# Patient Record
Sex: Male | Born: 1970 | Race: White | Hispanic: No | Marital: Married
Health system: Southern US, Community
[De-identification: ages and names within clinical notes are randomized; demographics above are authoritative.]

---

## 2012-04-20 ENCOUNTER — Ambulatory Visit: Payer: Self-pay | Admitting: Emergency Medicine

## 2015-01-07 NOTE — Op Note (Signed)
PATIENT NAME:  Stephen Allison, Stephen Allison MR#:  [id number] DATE OF BIRTH:  [birth year]  DATE OF PROCEDURE:  04/20/2012  PREOPERATIVE DIAGNOSIS: Right inguinal hernia.  POSTOPERATIVE DIAGNOSIS: Right inguinal hernia.  PROCEDURE: Right inguinal hernia with mesh.   SURGEON: Jovita GammaMasud Ahnna Dungan, MD  DESCRIPTION OF PROCEDURE: Under general anesthesia, the right colon was then prepped and draped. A small incision was made in the right groin. After cutting skin and subcutaneous tissue, the fascia was cut. The fascia was then reached. After cutting through the fascia, the inguinal canal was exposed. The cord was then lifted up. The patient was found to have a large lipoma of the cord which was then excised all the way down to the base of the inguinal ring and was transected and tied and after that we made sure that there was no injury to vas deferens. After this was done, there was no other hernia seen and then Prolene mesh was then placed below around the cord and sutured with 0 Vicryl sutures all around in the shelving edge of the inguinal ligament and conjoined tendon and around the cord. The inguinal canal was then closed with interrupted 0 Vicryl sutures. The subcuticular tissue was closed with 3-0 Vicryl. The skin was closed with staples. The patient tolerated the procedure well and was sent to the recovery room in satisfactory condition.  ____________________________ Alton RevereMasud S. Cecelia ByarsHashmi, MD [id number] D: 04/20/2012 10:54:17 ET T: 04/20/2012 11:55:15 ET JOB#: [id number]  cc: Kylie Gros S. Cecelia ByarsHashmi, MD, <Dictator> Ladona RidgelJarrod D. Kanady, FNP Meryle ReadyMASUD S Jamila Slatten MD ELECTRONICALLY SIGNED 04/21/2012 6:29

## 2017-01-24 ENCOUNTER — Other Ambulatory Visit: Payer: Self-pay | Admitting: Family Medicine

## 2017-01-24 ENCOUNTER — Ambulatory Visit
Admission: RE | Admit: 2017-01-24 | Discharge: 2017-01-24 | Disposition: A | Discharge status: Home / Self Care | Payer: 59 | Source: Ambulatory Visit | Attending: Family Medicine | Admitting: Family Medicine

## 2017-01-24 DIAGNOSIS — R1031 Right lower quadrant pain: Secondary | ICD-10-CM | POA: Diagnosis not present

## 2017-01-24 DIAGNOSIS — N433 Hydrocele, unspecified: Secondary | ICD-10-CM | POA: Insufficient documentation

## 2017-01-24 DIAGNOSIS — I861 Scrotal varices: Secondary | ICD-10-CM | POA: Insufficient documentation

## 2018-02-18 IMAGING — US US ART/VEN ABD/PELV/SCROTUM DOPPLER COMPLETE
1 series · 13 of 25 positions shown · non-contrast
Comparison: None.

CLINICAL DATA: Right inguinal testicle pain for 1 day. History of
right hernia repair

EXAM:
SCROTAL ULTRASOUND
DOPPLER ULTRASOUND OF THE TESTICLES
TECHNIQUE: Complete ultrasound examination of the testicles, epididymis, and
other scrotal structures was performed. Color and spectral Doppler
ultrasound were also utilized to evaluate blood flow to the
testicles.

[Series 1: us art/ven abd/pelv/scrotum doppler complete · 0.07mm/px · 13 of 66 slices shown]
[im 1/66]
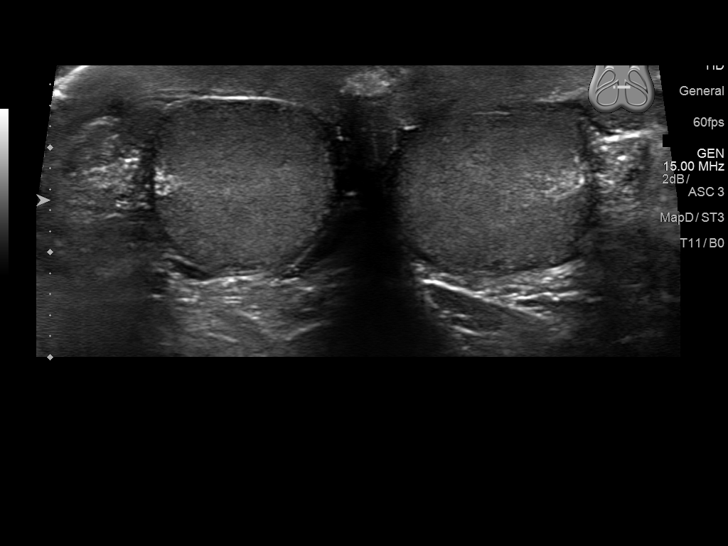
[im 6/66]
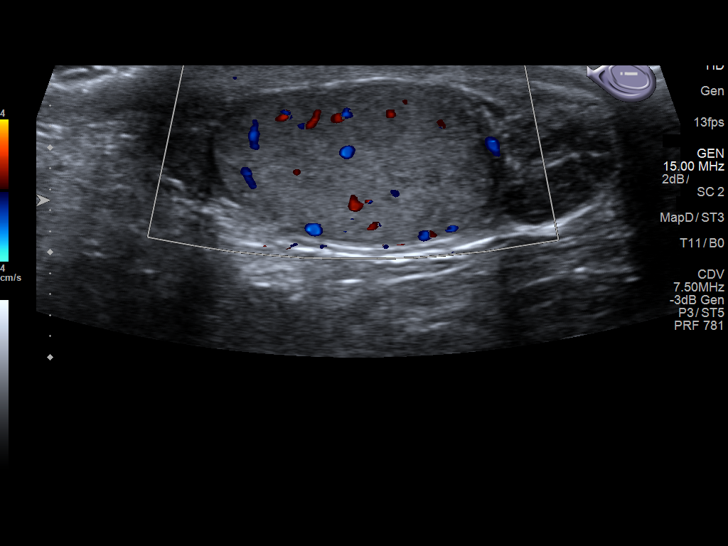
[im 11/66]
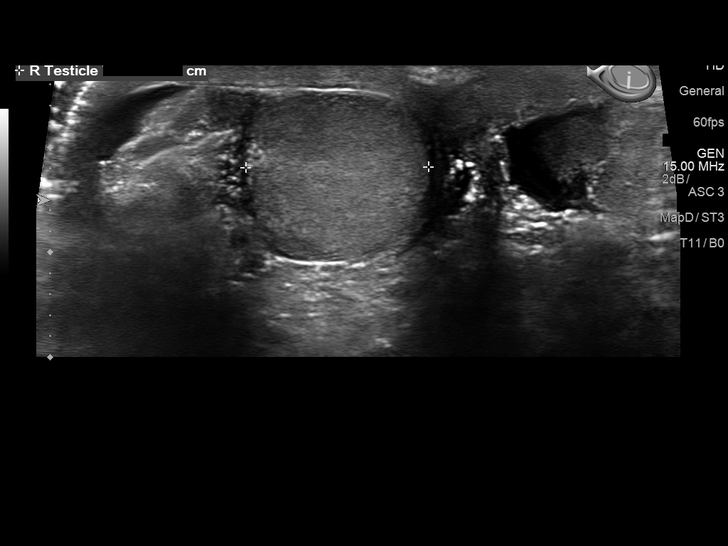
[im 17/66]
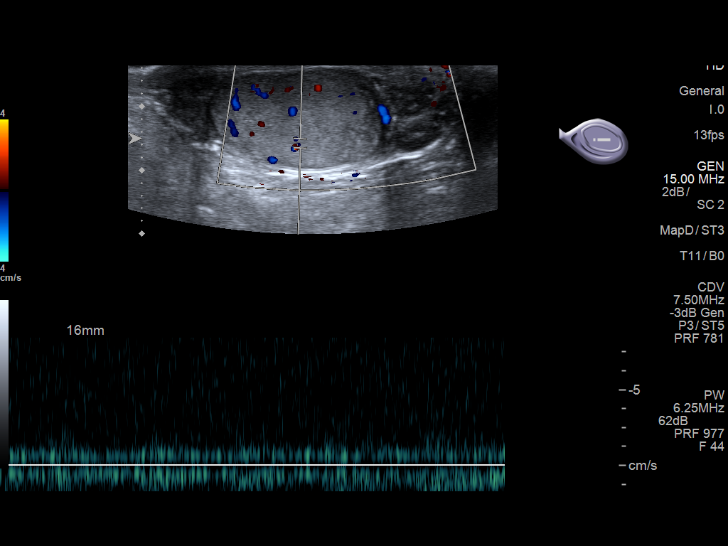
[im 22/66]
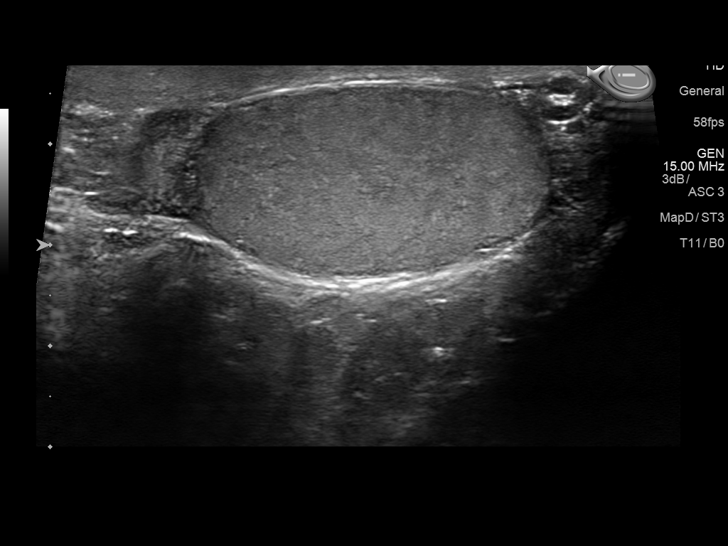
[im 28/66]
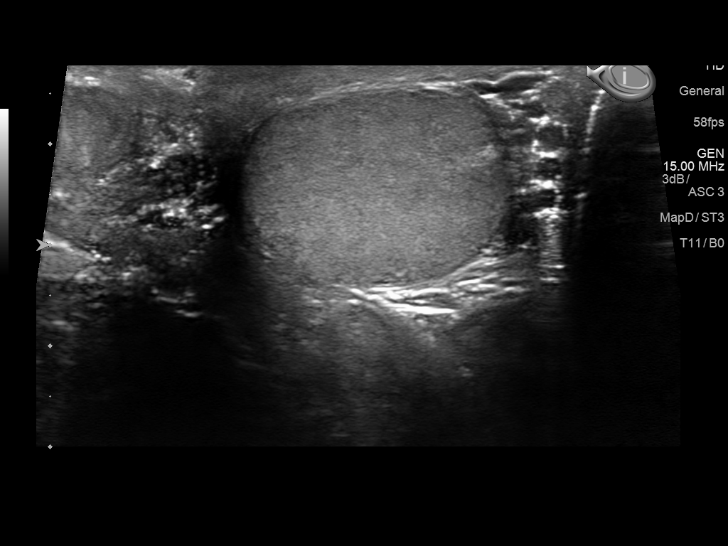
[im 33/66]
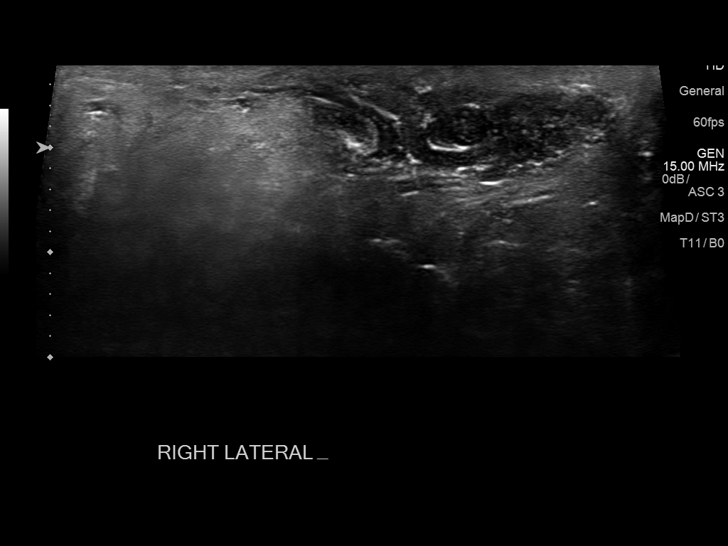
[im 38/66]
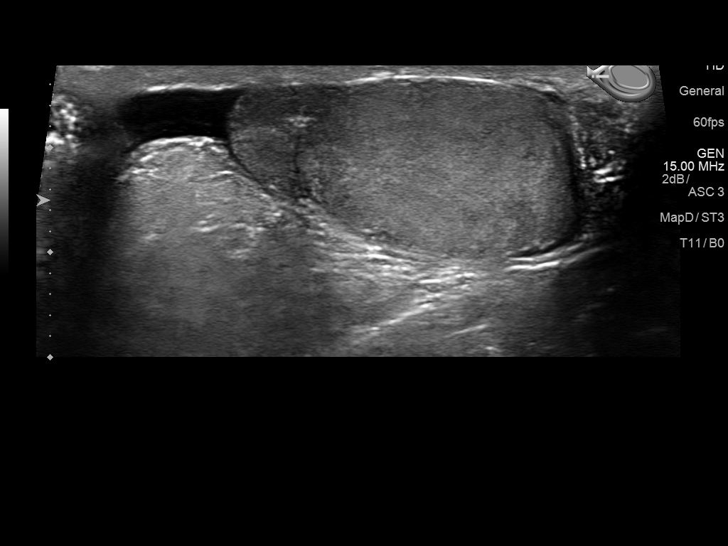
[im 44/66]
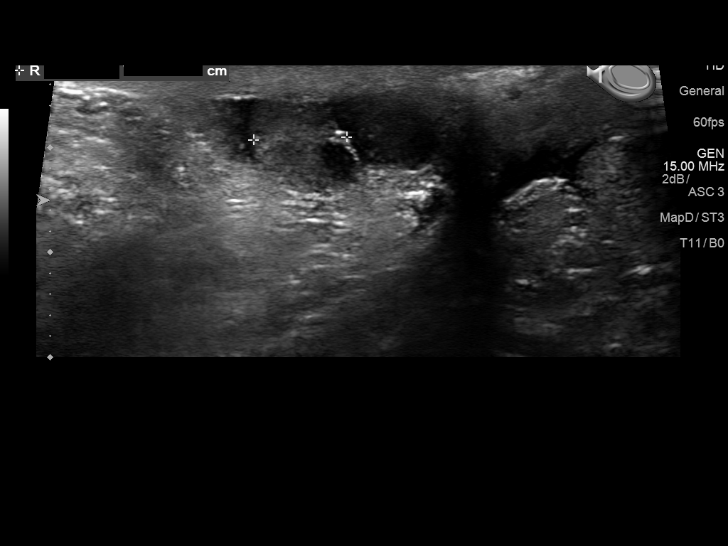
[im 49/66]
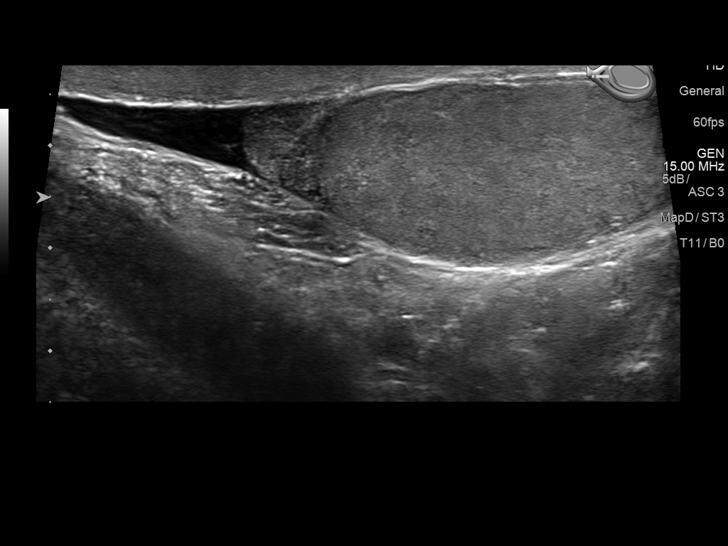
[im 55/66]
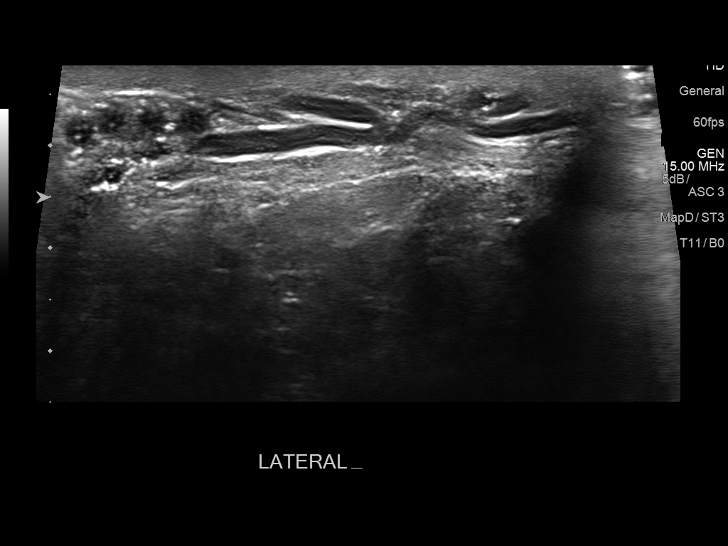
[im 60/66]
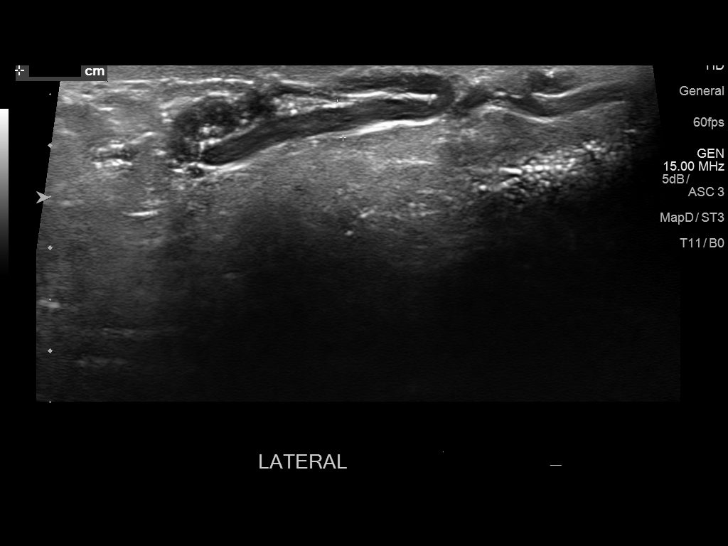
[im 66/66]
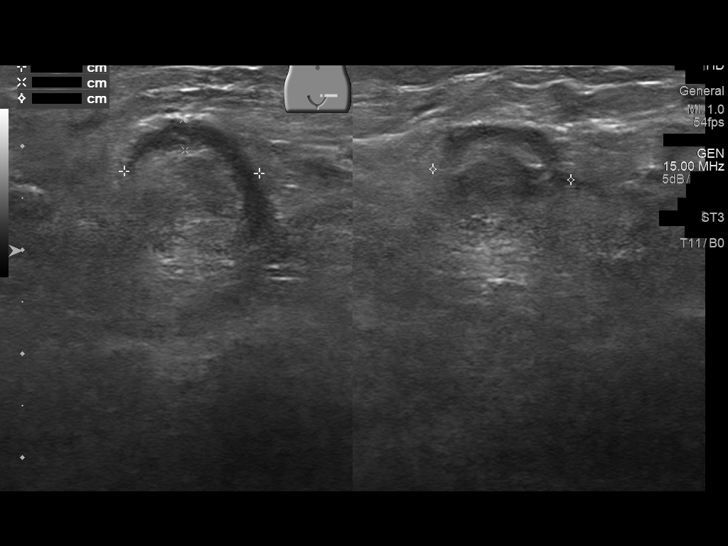

[13 of 25 positions shown; findings below may reference images not displayed]

FINDINGS: Right testicle

Measurements: 2.8 x 1.6 x 1.7 cm. No mass or microlithiasis
visualized.

Left testicle

Measurements: 3.4 x 2.0 x 2.2 cm. No mass or microlithiasis
visualized.

Right epididymis: Normal in size and appearance. Small epididymal
cyst measures 3.7 x 4.5 x 3.2 mm.

Left epididymis: Normal in size and appearance. Small scattered tiny
epididymal cysts.

Hydrocele:  Small simple appearing left hydrocele noted.

Varicocele:  Small left varicocele evident.

Pulsed Doppler interrogation of both testes demonstrates normal low
resistance arterial and venous waveforms bilaterally.

Included imaging of the right inguinal canal demonstrates no gross
hernia. Small benign-appearing lymph node visualize measuring
cm.
IMPRESSION: No acute finding by scrotal ultrasound.  Negative for torsion.

No acute testicular abnormality

Small left hydrocele

Small left varicocele
